# Patient Record
Sex: Male | Born: 1975 | Race: White | Hispanic: No | Marital: Married | Smoking: Current every day smoker
Health system: Southern US, Community
[De-identification: ages and names within clinical notes are randomized; demographics above are authoritative.]

## PROBLEM LIST (undated history)

## (undated) DIAGNOSIS — I10 Essential (primary) hypertension: Secondary | ICD-10-CM

## (undated) DIAGNOSIS — J45909 Unspecified asthma, uncomplicated: Secondary | ICD-10-CM

## (undated) DIAGNOSIS — E785 Hyperlipidemia, unspecified: Secondary | ICD-10-CM

## (undated) HISTORY — DX: Essential (primary) hypertension: I10

## (undated) HISTORY — DX: Hyperlipidemia, unspecified: E78.5

## (undated) HISTORY — DX: Unspecified asthma, uncomplicated: J45.909

---

## 2001-04-15 ENCOUNTER — Ambulatory Visit (HOSPITAL_COMMUNITY): Admission: RE | Admit: 2001-04-15 | Discharge: 2001-04-15 | Payer: Self-pay | Admitting: Internal Medicine

## 2002-09-17 ENCOUNTER — Ambulatory Visit: Admission: RE | Admit: 2002-09-17 | Discharge: 2002-09-17 | Payer: Self-pay | Admitting: Internal Medicine

## 2002-09-23 ENCOUNTER — Inpatient Hospital Stay (HOSPITAL_COMMUNITY): Admission: EM | Admit: 2002-09-23 | Discharge: 2002-09-27 | Payer: Self-pay | Admitting: Internal Medicine

## 2002-11-04 ENCOUNTER — Ambulatory Visit (HOSPITAL_COMMUNITY): Admission: RE | Admit: 2002-11-04 | Discharge: 2002-11-04 | Payer: Self-pay | Admitting: Internal Medicine

## 2003-01-28 ENCOUNTER — Inpatient Hospital Stay (HOSPITAL_COMMUNITY): Admission: EM | Admit: 2003-01-28 | Discharge: 2003-01-30 | Payer: Self-pay | Admitting: Emergency Medicine

## 2015-05-26 DIAGNOSIS — L600 Ingrowing nail: Secondary | ICD-10-CM | POA: Diagnosis not present

## 2015-06-09 DIAGNOSIS — L600 Ingrowing nail: Secondary | ICD-10-CM | POA: Diagnosis not present

## 2015-11-27 DIAGNOSIS — Z6835 Body mass index (BMI) 35.0-35.9, adult: Secondary | ICD-10-CM | POA: Diagnosis not present

## 2015-11-27 DIAGNOSIS — R05 Cough: Secondary | ICD-10-CM | POA: Diagnosis not present

## 2015-11-27 DIAGNOSIS — J060 Acute laryngopharyngitis: Secondary | ICD-10-CM | POA: Diagnosis not present

## 2015-11-27 DIAGNOSIS — R0981 Nasal congestion: Secondary | ICD-10-CM | POA: Diagnosis not present

## 2016-03-04 DIAGNOSIS — E782 Mixed hyperlipidemia: Secondary | ICD-10-CM | POA: Diagnosis not present

## 2016-03-11 DIAGNOSIS — Z0000 Encounter for general adult medical examination without abnormal findings: Secondary | ICD-10-CM | POA: Diagnosis not present

## 2016-03-11 DIAGNOSIS — E782 Mixed hyperlipidemia: Secondary | ICD-10-CM | POA: Diagnosis not present

## 2016-03-11 DIAGNOSIS — K219 Gastro-esophageal reflux disease without esophagitis: Secondary | ICD-10-CM | POA: Diagnosis not present

## 2016-03-11 DIAGNOSIS — E6609 Other obesity due to excess calories: Secondary | ICD-10-CM | POA: Diagnosis not present

## 2016-09-11 DIAGNOSIS — L309 Dermatitis, unspecified: Secondary | ICD-10-CM | POA: Diagnosis not present

## 2016-09-11 DIAGNOSIS — Z6839 Body mass index (BMI) 39.0-39.9, adult: Secondary | ICD-10-CM | POA: Diagnosis not present

## 2016-09-11 DIAGNOSIS — J0100 Acute maxillary sinusitis, unspecified: Secondary | ICD-10-CM | POA: Diagnosis not present

## 2016-09-11 DIAGNOSIS — R600 Localized edema: Secondary | ICD-10-CM | POA: Diagnosis not present

## 2016-09-30 DIAGNOSIS — Z713 Dietary counseling and surveillance: Secondary | ICD-10-CM | POA: Diagnosis not present

## 2016-09-30 DIAGNOSIS — R361 Hematospermia: Secondary | ICD-10-CM | POA: Diagnosis not present

## 2016-09-30 DIAGNOSIS — Z6841 Body Mass Index (BMI) 40.0 and over, adult: Secondary | ICD-10-CM | POA: Diagnosis not present

## 2016-09-30 DIAGNOSIS — E6609 Other obesity due to excess calories: Secondary | ICD-10-CM | POA: Diagnosis not present

## 2016-10-14 DIAGNOSIS — E6609 Other obesity due to excess calories: Secondary | ICD-10-CM | POA: Diagnosis not present

## 2016-10-14 DIAGNOSIS — R361 Hematospermia: Secondary | ICD-10-CM | POA: Diagnosis not present

## 2016-10-14 DIAGNOSIS — Z6841 Body Mass Index (BMI) 40.0 and over, adult: Secondary | ICD-10-CM | POA: Diagnosis not present

## 2016-11-11 DIAGNOSIS — Z23 Encounter for immunization: Secondary | ICD-10-CM | POA: Diagnosis not present

## 2016-11-11 DIAGNOSIS — Z6841 Body Mass Index (BMI) 40.0 and over, adult: Secondary | ICD-10-CM | POA: Diagnosis not present

## 2016-11-11 DIAGNOSIS — E6609 Other obesity due to excess calories: Secondary | ICD-10-CM | POA: Diagnosis not present

## 2016-12-11 DIAGNOSIS — J45901 Unspecified asthma with (acute) exacerbation: Secondary | ICD-10-CM | POA: Diagnosis not present

## 2016-12-11 DIAGNOSIS — Z6841 Body Mass Index (BMI) 40.0 and over, adult: Secondary | ICD-10-CM | POA: Diagnosis not present

## 2017-01-15 DIAGNOSIS — R600 Localized edema: Secondary | ICD-10-CM | POA: Diagnosis not present

## 2017-02-06 DIAGNOSIS — K219 Gastro-esophageal reflux disease without esophagitis: Secondary | ICD-10-CM | POA: Diagnosis not present

## 2017-02-06 DIAGNOSIS — Z713 Dietary counseling and surveillance: Secondary | ICD-10-CM | POA: Diagnosis not present

## 2017-02-06 DIAGNOSIS — E6609 Other obesity due to excess calories: Secondary | ICD-10-CM | POA: Diagnosis not present

## 2017-03-12 DIAGNOSIS — K219 Gastro-esophageal reflux disease without esophagitis: Secondary | ICD-10-CM | POA: Diagnosis not present

## 2017-03-12 DIAGNOSIS — Z6841 Body Mass Index (BMI) 40.0 and over, adult: Secondary | ICD-10-CM | POA: Diagnosis not present

## 2017-03-12 DIAGNOSIS — Z713 Dietary counseling and surveillance: Secondary | ICD-10-CM | POA: Diagnosis not present

## 2017-03-12 DIAGNOSIS — E663 Overweight: Secondary | ICD-10-CM | POA: Diagnosis not present

## 2017-03-12 DIAGNOSIS — I10 Essential (primary) hypertension: Secondary | ICD-10-CM | POA: Diagnosis not present

## 2017-04-09 DIAGNOSIS — E6609 Other obesity due to excess calories: Secondary | ICD-10-CM | POA: Diagnosis not present

## 2017-04-09 DIAGNOSIS — I10 Essential (primary) hypertension: Secondary | ICD-10-CM | POA: Diagnosis not present

## 2017-04-09 DIAGNOSIS — Z6841 Body Mass Index (BMI) 40.0 and over, adult: Secondary | ICD-10-CM | POA: Diagnosis not present

## 2017-04-09 DIAGNOSIS — Z713 Dietary counseling and surveillance: Secondary | ICD-10-CM | POA: Diagnosis not present

## 2017-05-06 DIAGNOSIS — R361 Hematospermia: Secondary | ICD-10-CM | POA: Diagnosis not present

## 2017-05-06 DIAGNOSIS — E782 Mixed hyperlipidemia: Secondary | ICD-10-CM | POA: Diagnosis not present

## 2017-05-06 DIAGNOSIS — E6609 Other obesity due to excess calories: Secondary | ICD-10-CM | POA: Diagnosis not present

## 2017-05-06 DIAGNOSIS — E663 Overweight: Secondary | ICD-10-CM | POA: Diagnosis not present

## 2017-05-06 DIAGNOSIS — K219 Gastro-esophageal reflux disease without esophagitis: Secondary | ICD-10-CM | POA: Diagnosis not present

## 2017-05-08 DIAGNOSIS — M25569 Pain in unspecified knee: Secondary | ICD-10-CM | POA: Diagnosis not present

## 2017-05-08 DIAGNOSIS — J309 Allergic rhinitis, unspecified: Secondary | ICD-10-CM | POA: Diagnosis not present

## 2017-05-08 DIAGNOSIS — K219 Gastro-esophageal reflux disease without esophagitis: Secondary | ICD-10-CM | POA: Diagnosis not present

## 2017-05-08 DIAGNOSIS — Z0000 Encounter for general adult medical examination without abnormal findings: Secondary | ICD-10-CM | POA: Diagnosis not present

## 2017-06-06 DIAGNOSIS — Z713 Dietary counseling and surveillance: Secondary | ICD-10-CM | POA: Diagnosis not present

## 2017-06-06 DIAGNOSIS — E6609 Other obesity due to excess calories: Secondary | ICD-10-CM | POA: Diagnosis not present

## 2017-06-06 DIAGNOSIS — M25562 Pain in left knee: Secondary | ICD-10-CM | POA: Diagnosis not present

## 2017-06-06 DIAGNOSIS — J309 Allergic rhinitis, unspecified: Secondary | ICD-10-CM | POA: Diagnosis not present

## 2017-07-11 DIAGNOSIS — Z713 Dietary counseling and surveillance: Secondary | ICD-10-CM | POA: Diagnosis not present

## 2017-07-11 DIAGNOSIS — Z6839 Body mass index (BMI) 39.0-39.9, adult: Secondary | ICD-10-CM | POA: Diagnosis not present

## 2017-07-11 DIAGNOSIS — M25569 Pain in unspecified knee: Secondary | ICD-10-CM | POA: Diagnosis not present

## 2017-11-04 DIAGNOSIS — E782 Mixed hyperlipidemia: Secondary | ICD-10-CM | POA: Diagnosis not present

## 2017-11-04 DIAGNOSIS — I10 Essential (primary) hypertension: Secondary | ICD-10-CM | POA: Diagnosis not present

## 2017-11-04 DIAGNOSIS — R361 Hematospermia: Secondary | ICD-10-CM | POA: Diagnosis not present

## 2017-11-04 DIAGNOSIS — K219 Gastro-esophageal reflux disease without esophagitis: Secondary | ICD-10-CM | POA: Diagnosis not present

## 2017-11-04 DIAGNOSIS — E6609 Other obesity due to excess calories: Secondary | ICD-10-CM | POA: Diagnosis not present

## 2017-11-06 DIAGNOSIS — Z713 Dietary counseling and surveillance: Secondary | ICD-10-CM | POA: Diagnosis not present

## 2017-11-06 DIAGNOSIS — K219 Gastro-esophageal reflux disease without esophagitis: Secondary | ICD-10-CM | POA: Diagnosis not present

## 2017-11-06 DIAGNOSIS — Z23 Encounter for immunization: Secondary | ICD-10-CM | POA: Diagnosis not present

## 2017-11-06 DIAGNOSIS — E7800 Pure hypercholesterolemia, unspecified: Secondary | ICD-10-CM | POA: Diagnosis not present

## 2017-11-06 DIAGNOSIS — L700 Acne vulgaris: Secondary | ICD-10-CM | POA: Diagnosis not present

## 2017-12-03 DIAGNOSIS — Z6841 Body Mass Index (BMI) 40.0 and over, adult: Secondary | ICD-10-CM | POA: Diagnosis not present

## 2017-12-03 DIAGNOSIS — M25562 Pain in left knee: Secondary | ICD-10-CM | POA: Diagnosis not present

## 2018-02-05 DIAGNOSIS — J019 Acute sinusitis, unspecified: Secondary | ICD-10-CM | POA: Diagnosis not present

## 2018-02-05 DIAGNOSIS — J060 Acute laryngopharyngitis: Secondary | ICD-10-CM | POA: Diagnosis not present

## 2018-05-13 DIAGNOSIS — J309 Allergic rhinitis, unspecified: Secondary | ICD-10-CM | POA: Diagnosis not present

## 2018-07-28 DIAGNOSIS — I10 Essential (primary) hypertension: Secondary | ICD-10-CM | POA: Diagnosis not present

## 2018-07-28 DIAGNOSIS — E6609 Other obesity due to excess calories: Secondary | ICD-10-CM | POA: Diagnosis not present

## 2018-07-28 DIAGNOSIS — E7800 Pure hypercholesterolemia, unspecified: Secondary | ICD-10-CM | POA: Diagnosis not present

## 2018-07-28 DIAGNOSIS — E782 Mixed hyperlipidemia: Secondary | ICD-10-CM | POA: Diagnosis not present

## 2018-07-28 DIAGNOSIS — R361 Hematospermia: Secondary | ICD-10-CM | POA: Diagnosis not present

## 2018-07-28 DIAGNOSIS — K219 Gastro-esophageal reflux disease without esophagitis: Secondary | ICD-10-CM | POA: Diagnosis not present

## 2018-08-04 DIAGNOSIS — J309 Allergic rhinitis, unspecified: Secondary | ICD-10-CM | POA: Diagnosis not present

## 2018-08-04 DIAGNOSIS — Z0001 Encounter for general adult medical examination with abnormal findings: Secondary | ICD-10-CM | POA: Diagnosis not present

## 2018-08-04 DIAGNOSIS — R0602 Shortness of breath: Secondary | ICD-10-CM | POA: Diagnosis not present

## 2018-08-04 DIAGNOSIS — K219 Gastro-esophageal reflux disease without esophagitis: Secondary | ICD-10-CM | POA: Diagnosis not present

## 2018-09-02 DIAGNOSIS — Z6841 Body Mass Index (BMI) 40.0 and over, adult: Secondary | ICD-10-CM | POA: Diagnosis not present

## 2018-09-02 DIAGNOSIS — R197 Diarrhea, unspecified: Secondary | ICD-10-CM | POA: Diagnosis not present

## 2018-09-02 DIAGNOSIS — Z20828 Contact with and (suspected) exposure to other viral communicable diseases: Secondary | ICD-10-CM | POA: Diagnosis not present

## 2018-11-06 DIAGNOSIS — Z713 Dietary counseling and surveillance: Secondary | ICD-10-CM | POA: Diagnosis not present

## 2018-11-06 DIAGNOSIS — M25562 Pain in left knee: Secondary | ICD-10-CM | POA: Diagnosis not present

## 2018-11-06 DIAGNOSIS — J309 Allergic rhinitis, unspecified: Secondary | ICD-10-CM | POA: Diagnosis not present

## 2018-11-23 ENCOUNTER — Other Ambulatory Visit: Payer: Self-pay

## 2018-11-23 ENCOUNTER — Ambulatory Visit: Payer: BC Managed Care – PPO | Admitting: Orthopedic Surgery

## 2018-11-23 ENCOUNTER — Ambulatory Visit: Payer: BC Managed Care – PPO

## 2018-11-23 ENCOUNTER — Encounter: Payer: Self-pay | Admitting: Orthopedic Surgery

## 2018-11-23 VITALS — BP 131/92 | HR 94 | Temp 97.2°F | Ht 72.0 in | Wt 285.2 lb

## 2018-11-23 DIAGNOSIS — G8929 Other chronic pain: Secondary | ICD-10-CM

## 2018-11-23 DIAGNOSIS — M25562 Pain in left knee: Secondary | ICD-10-CM

## 2018-11-23 DIAGNOSIS — M1710 Unilateral primary osteoarthritis, unspecified knee: Secondary | ICD-10-CM

## 2018-11-23 DIAGNOSIS — M1712 Unilateral primary osteoarthritis, left knee: Secondary | ICD-10-CM

## 2018-11-23 MED ORDER — DICLOFENAC POTASSIUM 50 MG PO TABS: 50.0000 mg | ORAL_TABLET | Freq: Two times a day (BID) | ORAL | 5 refills | Status: DC

## 2018-11-23 NOTE — Patient Instructions (Addendum)
KEEP THE WEIGHT DOWN   IF POSSIBLE STOP OR DECREASE THE AMOUNT OF TOBACCO USE   LIGHT EXERCISE DAILY 15 MIN WALK INCREASE AS TOLERATED    Chronic Knee Pain, Adult Chronic knee pain is pain in one or both knees that lasts longer than 3 months. Symptoms of chronic knee pain may include swelling, stiffness, and discomfort. Age-related wear and tear (osteoarthritis) of the knee joint is the most common cause of chronic knee pain. Other possible causes include:  A long-term immune-related disease that causes inflammation of the knee (rheumatoid arthritis). This usually affects both knees.  Inflammatory arthritis, such as gout or pseudogout.  An injury to the knee that causes arthritis.  An injury to the knee that damages the ligaments. Ligaments are strong tissues that connect bones to each other.  Runner's knee or pain behind the kneecap. Treatment for chronic knee pain depends on the cause. The main treatments for chronic knee pain are physical therapy and weight loss. This condition may also be treated with medicines, injections, a knee sleeve or brace, and by using crutches. Rest, ice, compression (pressure), and elevation (RICE) therapy may also be recommended. Follow these instructions at home: If you have a knee sleeve or brace:   Wear it as told by your health care provider. Remove it only as told by your health care provider.  Loosen it if your toes tingle, become numb, or turn cold and blue.  Keep it clean.  If the sleeve or brace is not waterproof: ? Do not let it get wet. ? Remove it if allowed by your health care provider, or cover it with a watertight covering when you take a bath or a shower. Managing pain, stiffness, and swelling      If directed, apply heat to the affected area as often as told by your health care provider. Use the heat source that your health care provider recommends, such as a moist heat pack or a heating pad. ? If you have a removable sleeve or  brace, remove it as told by your health care provider. ? Place a towel between your skin and the heat source. ? Leave the heat on for 20-30 minutes. ? Remove the heat if your skin turns bright red. This is especially important if you are unable to feel pain, heat, or cold. You may have a greater risk of getting burned.  If directed, put ice on the affected area. ? If you have a removable sleeve or brace, remove it as told by your health care provider. ? Put ice in a plastic bag. ? Place a towel between your skin and the bag. ? Leave the ice on for 20 minutes, 2-3 times a day.  Move your toes often to reduce stiffness and swelling.  Raise (elevate) the injured area above the level of your heart while you are sitting or lying down. Activity  Avoid activities where both feet leave the ground at the same time (high-impact activities). Examples are running, jumping rope, and doing jumping jacks.  Return to your normal activities as told by your health care provider. Ask your health care provider what activities are safe for you.  Follow the exercise plan that your health care provider designed for you. Your health care provider may suggest that you: ? Avoid activities that make knee pain worse. This may require you to change your exercise routines, sport participation, or job duties. ? Wear shoes with cushioned soles. ? Avoid high-impact activities or sports that require  running and sudden changes in direction. ? Do physical therapy as told by your health care provider. Physical therapy is planned to match your needs and abilities. It may include exercises for strength, flexibility, stability, and endurance. ? Do exercises that increase balance and strength, such as tai chi and yoga.  Do not use the injured limb to support your body weight until your health care provider says that you can. Use crutches, a cane, or a walker, as told by your health care provider. General instructions  Take  over-the-counter and prescription medicines only as told by your health care provider.  Lose weight if you are overweight. Losing even a little weight can reduce knee pain. Ask your health care provider what your ideal weight is, and how to safely lose extra weight. A food expert (dietitian) may be able to help you plan your meals.  Do not use any products that contain nicotine or tobacco, such as cigarettes, e-cigarettes, and chewing tobacco. These can delay healing. If you need help quitting, ask your health care provider.  Keep all follow-up visits as told by your health care provider. This is important. Contact a health care provider if:  You have knee pain that is not getting better or gets worse.  You are unable to do your physical therapy exercises due to knee pain. Get help right away if:  Your knee swells and the swelling becomes worse.  You cannot move your knee.  You have severe knee pain. Summary  Knee pain that lasts more than 3 months is considered chronic knee pain.  The main treatments for chronic knee pain are physical therapy and weight loss. You may also need to take medicines, wear a knee sleeve or brace, use crutches, and apply ice or heat.  Losing even a little weight can reduce knee pain. Ask your health care provider what your ideal weight is, and how to safely lose extra weight. A food expert (dietitian) may be able to help you plan your meals.  Work with a physical therapist to make a safe exercise program, as told by your health care provider. This information is not intended to replace advice given to you by your health care provider. Make sure you discuss any questions you have with your health care provider. Document Released: 04/16/2018 Document Revised: 04/16/2018 Document Reviewed: 04/16/2018 Elsevier Patient Education  2020 [employer].
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
## 2019-01-04 ENCOUNTER — Ambulatory Visit (INDEPENDENT_AMBULATORY_CARE_PROVIDER_SITE_OTHER): Payer: BC Managed Care – PPO | Admitting: Orthopedic Surgery

## 2019-01-04 ENCOUNTER — Other Ambulatory Visit: Payer: Self-pay

## 2019-01-04 VITALS — BP 135/98 | HR 93 | Temp 97.3°F | Ht 72.0 in | Wt 285.0 lb

## 2019-01-04 DIAGNOSIS — G8929 Other chronic pain: Secondary | ICD-10-CM

## 2019-01-04 DIAGNOSIS — M25562 Pain in left knee: Secondary | ICD-10-CM

## 2019-01-04 MED ORDER — MELOXICAM 7.5 MG PO TABS: 7.5000 mg | ORAL_TABLET | Freq: Two times a day (BID) | ORAL | 0 refills | Status: DC
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
## 2019-01-07 ENCOUNTER — Telehealth: Payer: Self-pay | Admitting: Radiology
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
## 2019-01-07 NOTE — Telephone Encounter (Signed)
Call received (on line currently) from [place] at [facility] regarding pre-certification, ph# [phone]
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
## 2019-01-07 NOTE — Telephone Encounter (Signed)
cancel

## 2019-01-13 DIAGNOSIS — Z713 Dietary counseling and surveillance: Secondary | ICD-10-CM | POA: Diagnosis not present

## 2019-01-13 DIAGNOSIS — J309 Allergic rhinitis, unspecified: Secondary | ICD-10-CM | POA: Diagnosis not present

## 2019-01-13 DIAGNOSIS — M25562 Pain in left knee: Secondary | ICD-10-CM | POA: Diagnosis not present

## 2019-02-10 DIAGNOSIS — Z20828 Contact with and (suspected) exposure to other viral communicable diseases: Secondary | ICD-10-CM | POA: Diagnosis not present

## 2019-02-10 DIAGNOSIS — E782 Mixed hyperlipidemia: Secondary | ICD-10-CM | POA: Diagnosis not present

## 2019-02-10 DIAGNOSIS — E6609 Other obesity due to excess calories: Secondary | ICD-10-CM | POA: Diagnosis not present

## 2019-02-10 DIAGNOSIS — Z713 Dietary counseling and surveillance: Secondary | ICD-10-CM | POA: Diagnosis not present

## 2019-02-10 DIAGNOSIS — R361 Hematospermia: Secondary | ICD-10-CM | POA: Diagnosis not present

## 2019-02-10 DIAGNOSIS — K219 Gastro-esophageal reflux disease without esophagitis: Secondary | ICD-10-CM | POA: Diagnosis not present

## 2019-03-15 DIAGNOSIS — Z6841 Body Mass Index (BMI) 40.0 and over, adult: Secondary | ICD-10-CM | POA: Diagnosis not present

## 2019-04-12 DIAGNOSIS — I10 Essential (primary) hypertension: Secondary | ICD-10-CM | POA: Diagnosis not present

## 2019-04-12 DIAGNOSIS — E7800 Pure hypercholesterolemia, unspecified: Secondary | ICD-10-CM | POA: Diagnosis not present

## 2019-04-12 DIAGNOSIS — R361 Hematospermia: Secondary | ICD-10-CM | POA: Diagnosis not present

## 2019-04-12 DIAGNOSIS — K219 Gastro-esophageal reflux disease without esophagitis: Secondary | ICD-10-CM | POA: Diagnosis not present

## 2019-04-12 DIAGNOSIS — E6609 Other obesity due to excess calories: Secondary | ICD-10-CM | POA: Diagnosis not present

## 2019-04-12 DIAGNOSIS — E782 Mixed hyperlipidemia: Secondary | ICD-10-CM | POA: Diagnosis not present

## 2019-04-15 DIAGNOSIS — E785 Hyperlipidemia, unspecified: Secondary | ICD-10-CM | POA: Diagnosis not present

## 2019-04-15 DIAGNOSIS — M25562 Pain in left knee: Secondary | ICD-10-CM | POA: Diagnosis not present

## 2019-04-15 DIAGNOSIS — Z6841 Body Mass Index (BMI) 40.0 and over, adult: Secondary | ICD-10-CM | POA: Diagnosis not present

## 2019-08-24 DIAGNOSIS — J069 Acute upper respiratory infection, unspecified: Secondary | ICD-10-CM | POA: Diagnosis not present

## 2019-10-05 DIAGNOSIS — E7800 Pure hypercholesterolemia, unspecified: Secondary | ICD-10-CM | POA: Diagnosis not present

## 2019-10-05 DIAGNOSIS — E6609 Other obesity due to excess calories: Secondary | ICD-10-CM | POA: Diagnosis not present

## 2019-10-05 DIAGNOSIS — E782 Mixed hyperlipidemia: Secondary | ICD-10-CM | POA: Diagnosis not present

## 2019-10-05 DIAGNOSIS — I10 Essential (primary) hypertension: Secondary | ICD-10-CM | POA: Diagnosis not present

## 2019-10-05 DIAGNOSIS — R361 Hematospermia: Secondary | ICD-10-CM | POA: Diagnosis not present

## 2019-10-05 DIAGNOSIS — K219 Gastro-esophageal reflux disease without esophagitis: Secondary | ICD-10-CM | POA: Diagnosis not present

## 2019-10-07 DIAGNOSIS — Z0001 Encounter for general adult medical examination with abnormal findings: Secondary | ICD-10-CM | POA: Diagnosis not present

## 2019-11-01 DIAGNOSIS — L570 Actinic keratosis: Secondary | ICD-10-CM | POA: Diagnosis not present

## 2019-11-01 DIAGNOSIS — C44622 Squamous cell carcinoma of skin of right upper limb, including shoulder: Secondary | ICD-10-CM | POA: Diagnosis not present

## 2019-11-01 DIAGNOSIS — X32XXXA Exposure to sunlight, initial encounter: Secondary | ICD-10-CM | POA: Diagnosis not present

## 2019-11-15 DIAGNOSIS — Z85828 Personal history of other malignant neoplasm of skin: Secondary | ICD-10-CM | POA: Diagnosis not present

## 2019-11-15 DIAGNOSIS — L820 Inflamed seborrheic keratosis: Secondary | ICD-10-CM | POA: Diagnosis not present

## 2019-11-15 DIAGNOSIS — L281 Prurigo nodularis: Secondary | ICD-10-CM | POA: Diagnosis not present

## 2019-11-15 DIAGNOSIS — L570 Actinic keratosis: Secondary | ICD-10-CM | POA: Diagnosis not present

## 2019-11-15 DIAGNOSIS — X32XXXD Exposure to sunlight, subsequent encounter: Secondary | ICD-10-CM | POA: Diagnosis not present

## 2019-11-15 DIAGNOSIS — L859 Epidermal thickening, unspecified: Secondary | ICD-10-CM | POA: Diagnosis not present

## 2019-11-15 DIAGNOSIS — Z08 Encounter for follow-up examination after completed treatment for malignant neoplasm: Secondary | ICD-10-CM | POA: Diagnosis not present

## 2020-02-06 ENCOUNTER — Ambulatory Visit
Admission: RE | Admit: 2020-02-06 | Discharge: 2020-02-06 | Disposition: A | Discharge status: Home / Self Care | Payer: BC Managed Care – PPO | Source: Ambulatory Visit | Attending: Emergency Medicine | Admitting: Emergency Medicine

## 2020-02-06 ENCOUNTER — Other Ambulatory Visit: Payer: Self-pay

## 2020-02-06 VITALS — BP 157/99 | HR 107 | Temp 99.4°F | Resp 15

## 2020-02-06 DIAGNOSIS — H6592 Unspecified nonsuppurative otitis media, left ear: Secondary | ICD-10-CM | POA: Diagnosis not present

## 2020-02-06 DIAGNOSIS — Z1152 Encounter for screening for COVID-19: Secondary | ICD-10-CM | POA: Diagnosis not present

## 2020-02-06 DIAGNOSIS — J069 Acute upper respiratory infection, unspecified: Secondary | ICD-10-CM | POA: Diagnosis not present

## 2020-02-06 MED ORDER — FLUTICASONE PROPIONATE 50 MCG/ACT NA SUSP: 1.0000 | Freq: Every day | NASAL | 0 refills | Status: DC

## 2020-02-06 MED ORDER — CETIRIZINE HCL 10 MG PO TABS: 10.0000 mg | ORAL_TABLET | Freq: Every day | ORAL | 0 refills | Status: AC

## 2020-02-06 MED ORDER — PREDNISONE 10 MG PO TABS: 20.0000 mg | ORAL_TABLET | Freq: Every day | ORAL | 0 refills | Status: DC

## 2020-02-06 MED ORDER — BENZONATATE 100 MG PO CAPS: 100.0000 mg | ORAL_CAPSULE | Freq: Three times a day (TID) | ORAL | 0 refills | Status: AC | PRN

## 2020-02-06 NOTE — Discharge Instructions (Signed)
COVID testing ordered.  It will take between 2-7 days for test results.  Someone will contact you regarding abnormal results.   ° °In the meantime: °You should remain isolated in your home for 10 days from symptom onset AND greater than 72 hours after symptoms resolution (absence of fever without the use of fever-reducing medication and improvement in respiratory symptoms), whichever is longer °Get plenty of rest and push fluids °Tessalon Perles prescribed for cough °Zyrtec for nasal congestion, runny nose, and/or sore throat °Flonase for nasal congestion and runny nose °Prednisone was prescribed °Use medications daily for symptom relief °Use OTC medications like ibuprofen or tylenol as needed fever or pain °Call or go to the ED if you have any new or worsening symptoms such as fever, worsening cough, shortness of breath, chest tightness, chest pain, turning blue, changes in mental status, etc... °
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
## 2020-02-06 NOTE — ED Provider Notes (Signed)
[facility]   [id number] 02/06/20 Arrival Time: 1345   CC: COVID symptoms  SUBJECTIVE: History from: patient.  Cory Page is a 44 y.o. male who resented to the urgent care with a complaint of ear pain, cough, nasal congestion and Covid exposure for the past few days.  Denies sick exposure to COVID, flu or strep.  Denies recent travel.  Has tried OTC medication without relief.  Denies aggravating factors.  Denies previous symptoms in the past.   Denies fever, chills, fatigue, sinus pain, rhinorrhea, sore throat, SOB, wheezing, chest pain, nausea, changes in bowel or bladder habits.    ROS: As per HPI.  All other pertinent ROS negative.     Past Medical History:  Diagnosis Date  . Asthma    History reviewed. No pertinent surgical history. No Known Allergies No current facility-administered medications on file prior to encounter.   Current Outpatient Medications on File Prior to Encounter  Medication Sig Dispense Refill  . diclofenac (CATAFLAM) 50 MG tablet Take 1 tablet (50 mg total) by mouth 2 (two) times daily. 60 tablet 5  . loratadine (CLARITIN) 10 MG tablet Take 10 mg by mouth daily.    . meloxicam (MOBIC) 7.5 MG tablet Take 1 tablet (7.5 mg total) by mouth 2 (two) times daily. 60 tablet 0  . VENTOLIN HFA 108 (90 Base) MCG/ACT inhaler Inhale 1 puff into the lungs every 6 (six) hours as needed (2x day every 2 - 3 days).      Social History   Socioeconomic History  . Marital status: Married    Spouse name: Not on file  . Number of children: Not on file  . Years of education: Not on file  . Highest education level: Not on file  Occupational History  . Not on file  Tobacco Use  . Smoking status: Never Smoker  . Smokeless tobacco: Never Used  Substance and Sexual Activity  . Alcohol use: Not on file  . Drug use: Not on file  . Sexual activity: Not on file  Other Topics Concern  . Not on file  Social History Narrative  . Not on file   Social  Determinants of Health   Financial Resource Strain: Not on file  Food Insecurity: Not on file  Transportation Needs: Not on file  Physical Activity: Not on file  Stress: Not on file  Social Connections: Not on file  Intimate Partner Violence: Not on file   Family History  Problem Relation Age of Onset  . Diabetes Father   . Heart disease Maternal Grandmother   . Heart disease Maternal Grandfather   . Cancer Paternal Grandfather        bladder    OBJECTIVE:  Vitals:   02/06/20 1423  BP: (!) 157/99  Pulse: (!) 107  Resp: 15  Temp: 99.4 F (37.4 C)  SpO2: 97%     General appearance: alert; appears fatigued, but nontoxic; speaking in full sentences and tolerating own secretions HEENT: NCAT; Ears: EACs clear, TMs pearly gray; Eyes: PERRL.  EOM grossly intact. Sinuses: nontender; Nose: nares patent without rhinorrhea, Throat: oropharynx clear, tonsils non erythematous or enlarged, uvula midline  Neck: supple without LAD Lungs: unlabored respirations, symmetrical air entry; cough: moderate; no respiratory distress; CTAB Heart: regular rate and rhythm.  Radial pulses 2+ symmetrical bilaterally Skin: warm and dry Psychological: alert and cooperative; normal mood and affect  LABS:  No results found for this or any previous visit (from the past 24 hour(s)).  ASSESSMENT & PLAN:  1. Encounter for screening for COVID-19   2. URI with cough and congestion   3. Fluid level behind tympanic membrane of left ear     Meds ordered this encounter  Medications  . cetirizine (ZYRTEC ALLERGY) 10 MG tablet    Sig: Take 1 tablet (10 mg total) by mouth daily.    Dispense:  30 tablet    Refill:  0  . benzonatate (TESSALON) 100 MG capsule    Sig: Take 1 capsule (100 mg total) by mouth 3 (three) times daily as needed for cough.    Dispense:  30 capsule    Refill:  0  . fluticasone (FLONASE) 50 MCG/ACT nasal spray    Sig: Place 1 spray into both nostrils daily for 14 days.    Dispense:   16 g    Refill:  0  . predniSONE (DELTASONE) 10 MG tablet    Sig: Take 2 tablets (20 mg total) by mouth daily.    Dispense:  15 tablet    Refill:  0    Discharge Instructions.    COVID testing ordered.  It will take between 2-7 days for test results.  Someone will contact you regarding abnormal results.    In the meantime: You should remain isolated in your home for 10 days from symptom onset AND greater than 72 hours after symptoms resolution (absence of fever without the use of fever-reducing medication and improvement in respiratory symptoms), whichever is longer Get plenty of rest and push fluids Tessalon Perles prescribed for cough Zyrtec for nasal congestion, runny nose, and/or sore throat Flonase for nasal congestion and runny nose Prednisone was prescribed Use medications daily for symptom relief Use OTC medications like ibuprofen or tylenol as needed fever or pain Call or go to the ED if you have any new or worsening symptoms such as fever, worsening cough, shortness of breath, chest tightness, chest pain, turning blue, changes in mental status, etc...   Reviewed expectations re: course of current medical issues. Questions answered. Outlined signs and symptoms indicating need for more acute intervention. Patient verbalized understanding. After Visit Summary given.         Durward Parcel, FNP 02/06/20 1536

## 2020-02-08 LAB — NOVEL CORONAVIRUS, NAA: SARS-CoV-2, NAA: DETECTED — AB

## 2020-02-08 LAB — SARS-COV-2, NAA 2 DAY TAT

## 2020-02-09 ENCOUNTER — Telehealth (HOSPITAL_COMMUNITY): Payer: Self-pay | Admitting: Family

## 2020-02-09 NOTE — Telephone Encounter (Signed)
.  Called to discuss with [name] about Covid symptoms and the use of casirivimab/imdevimab, a combination monoclonal antibody infusion for those with mild to moderate Covid symptoms and at a high risk of hospitalization.     Pt is qualified for this infusion at the infusion center due to co-morbid conditions and/or a member of an at-risk group, however he is not a candidate at this time as he has been fully vaccinated with Laural Benes and [employer] vaccine. Symptoms tier reviewed as well as criteria for ending isolation.  Symptoms reviewed that would warrant ED/Hospital evaluation. Preventative practices reviewed. Patient verbalized understanding.   There are no problems to display for this patient.   Cory Shorey,NP

## 2020-02-15 ENCOUNTER — Ambulatory Visit
Admission: RE | Admit: 2020-02-15 | Discharge: 2020-02-15 | Disposition: A | Discharge status: Home / Self Care | Payer: BC Managed Care – PPO | Source: Ambulatory Visit | Attending: Physician Assistant | Admitting: Physician Assistant

## 2020-02-15 ENCOUNTER — Other Ambulatory Visit: Payer: Self-pay

## 2020-02-15 ENCOUNTER — Ambulatory Visit (INDEPENDENT_AMBULATORY_CARE_PROVIDER_SITE_OTHER): Payer: BC Managed Care – PPO

## 2020-02-15 VITALS — BP 153/85 | HR 97 | Temp 98.3°F | Resp 19

## 2020-02-15 DIAGNOSIS — J189 Pneumonia, unspecified organism: Secondary | ICD-10-CM | POA: Diagnosis not present

## 2020-02-15 DIAGNOSIS — R0602 Shortness of breath: Secondary | ICD-10-CM | POA: Diagnosis not present

## 2020-02-15 DIAGNOSIS — J129 Viral pneumonia, unspecified: Secondary | ICD-10-CM | POA: Diagnosis not present

## 2020-02-15 DIAGNOSIS — R059 Cough, unspecified: Secondary | ICD-10-CM

## 2020-02-15 DIAGNOSIS — U071 COVID-19: Secondary | ICD-10-CM

## 2020-02-15 MED ORDER — DEXAMETHASONE 6 MG PO TABS: 6.0000 mg | ORAL_TABLET | Freq: Two times a day (BID) | ORAL | 0 refills | Status: DC

## 2020-02-15 MED ORDER — ALBUTEROL SULFATE HFA 108 (90 BASE) MCG/ACT IN AERS: 2.0000 | INHALATION_SPRAY | Freq: Four times a day (QID) | RESPIRATORY_TRACT | 0 refills | Status: AC | PRN

## 2020-02-15 NOTE — ED Provider Notes (Signed)
[facility]    CSN: [id number] Arrival date & time: 02/15/20  1557      History   Chief Complaint Chief Complaint  Patient presents with  . Appointment    4    HPI Cory Page is a 44 y.o. male.   The history is provided by the patient. No language interpreter was used.  Cough Cough characteristics:  Productive Sputum characteristics:  Nondescript Severity:  Moderate Onset quality:  Gradual Duration:  9 days Timing:  Constant Progression:  Worsening Smoker: yes   Relieved by:  Nothing Worsened by:  Nothing Ineffective treatments:  None tried Associated symptoms: shortness of breath   Pt diagnosed with covid on 12/19.  Pt reports increased shortness of breath  Past Medical History:  Diagnosis Date  . Asthma     There are no problems to display for this patient.   History reviewed. No pertinent surgical history.     Home Medications    Prior to Admission medications   Medication Sig Start Date End Date Taking? Authorizing Provider  benzonatate (TESSALON) 100 MG capsule Take 1 capsule (100 mg total) by mouth 3 (three) times daily as needed for cough. 02/06/20   Avegno, Zachery Dakins, FNP  cetirizine (ZYRTEC ALLERGY) 10 MG tablet Take 1 tablet (10 mg total) by mouth daily. 02/06/20   Avegno, Zachery Dakins, FNP  diclofenac (CATAFLAM) 50 MG tablet Take 1 tablet (50 mg total) by mouth 2 (two) times daily. 11/23/18   Vickki Hearing, MD  fluticasone (FLONASE) 50 MCG/ACT nasal spray Place 1 spray into both nostrils daily for 14 days. 02/06/20 02/20/20  Avegno, Zachery Dakins, FNP  loratadine (CLARITIN) 10 MG tablet Take 10 mg by mouth daily.    [provider]  meloxicam (MOBIC) 7.5 MG tablet Take 1 tablet (7.5 mg total) by mouth 2 (two) times daily. 01/04/19   Vickki Hearing, MD  predniSONE (DELTASONE) 10 MG tablet Take 2 tablets (20 mg total) by mouth daily. 02/06/20   Avegno, Zachery Dakins, FNP  VENTOLIN HFA 108 (90 Base) MCG/ACT inhaler Inhale  1 puff into the lungs every 6 (six) hours as needed (2x day every 2 - 3 days).  11/19/18   [provider]    Family History Family History  Problem Relation Age of Onset  . Diabetes Father   . Heart disease Maternal Grandmother   . Heart disease Maternal Grandfather   . Cancer Paternal Grandfather        bladder    Social History Social History   Tobacco Use  . Smoking status: Never Smoker  . Smokeless tobacco: Never Used     Allergies   Patient has no known allergies.   Review of Systems Review of Systems  Respiratory: Positive for cough and shortness of breath.   All other systems reviewed and are negative.    Physical Exam Triage Vital Signs ED Triage Vitals  Enc Vitals Group     BP 02/15/20 1659 (!) 153/85     Pulse Rate 02/15/20 1659 97     Resp 02/15/20 1659 19     Temp 02/15/20 1659 98.3 F (36.8 C)     Temp src --      SpO2 02/15/20 1659 95 %     Weight --      Height --      Head Circumference --      Peak Flow --      Pain Score 02/15/20 1657 3  Pain Loc --      Pain Edu? --      Excl. in GC? --    No data found.  Updated Vital Signs BP (!) 153/85   Pulse 97   Temp 98.3 F (36.8 C)   Resp 19   SpO2 95%   Visual Acuity Right Eye Distance:   Left Eye Distance:   Bilateral Distance:    Right Eye Near:   Left Eye Near:    Bilateral Near:     Physical Exam Vitals and nursing note reviewed.  Constitutional:      Appearance: He is well-developed and well-nourished.  HENT:     Head: Normocephalic and atraumatic.     Mouth/Throat:     Mouth: Mucous membranes are moist.  Eyes:     Conjunctiva/sclera: Conjunctivae normal.  Cardiovascular:     Rate and Rhythm: Normal rate and regular rhythm.     Heart sounds: No murmur heard.   Pulmonary:     Effort: Pulmonary effort is normal. No respiratory distress.     Breath sounds: Rhonchi present.  Abdominal:     Palpations: Abdomen is soft.     Tenderness: There is no  abdominal tenderness.  Musculoskeletal:        General: No edema.     Cervical back: Neck supple.  Skin:    General: Skin is warm and dry.  Neurological:     General: No focal deficit present.     Mental Status: He is alert.  Psychiatric:        Mood and Affect: Mood and affect and mood normal.      UC Treatments / Results  Labs (all labs ordered are listed, but only abnormal results are displayed) Labs Reviewed - No data to display  EKG   Radiology DG Chest 2 View  Result Date: 02/15/2020 CLINICAL DATA:  Coronavirus infection.  Cough.  Shortness of breath. EXAM: CHEST - 2 VIEW COMPARISON:  None FINDINGS: Heart and mediastinal shadows are normal. There are mild bilateral hazy and patchy pulmonary infiltrates consistent with viral pneumonia. No dense consolidation, collapse or effusion. No significant bone finding. IMPRESSION: Mild bilateral hazy and patchy pulmonary infiltrates consistent with viral pneumonia. Electronically Signed   By: Paulina Fusi M.D.   On: 02/15/2020 17:34    Procedures Procedures (including critical care time)  Medications Ordered in UC Medications - No data to display  Initial Impression / Assessment and Plan / UC Course  I have reviewed the triage vital signs and the nursing notes.  Pertinent labs & imaging results that were available during my care of the patient were reviewed by me and considered in my medical decision making (see chart for details).     MDM:  Chest xray shows pneumonia.  Pt advised to go to ED if symptoms worsen or cahnge Final Clinical Impressions(s) / UC Diagnoses   Final diagnoses:  Pneumonia, viral     Discharge Instructions     Go to the Emergency department if symptoms worse or change    ED Prescriptions    None     PDMP not reviewed this encounter.  An After Visit Summary was printed and given to the patient.   Elson Areas, [place] 02/15/20 1756

## 2020-02-15 NOTE — ED Triage Notes (Addendum)
Pt presents with complaints of being light headed, fatigued, weak, and headache x 2 days. Pt states when he woke up this morning he was diaphoretic. Pt denies any chest pain. Pt tested positive for covid on 12/19.

## 2020-02-15 NOTE — Discharge Instructions (Addendum)
Go to the Emergency department if symptoms worse or change

## 2020-02-29 ENCOUNTER — Other Ambulatory Visit: Payer: Self-pay | Admitting: Orthopedic Surgery

## 2020-02-29 DIAGNOSIS — M25562 Pain in left knee: Secondary | ICD-10-CM

## 2020-02-29 DIAGNOSIS — G8929 Other chronic pain: Secondary | ICD-10-CM

## 2020-03-27 ENCOUNTER — Ambulatory Visit (INDEPENDENT_AMBULATORY_CARE_PROVIDER_SITE_OTHER): Payer: BC Managed Care – PPO | Admitting: Orthopedic Surgery

## 2020-03-27 ENCOUNTER — Other Ambulatory Visit: Payer: Self-pay

## 2020-03-27 ENCOUNTER — Encounter: Payer: Self-pay | Admitting: Orthopedic Surgery

## 2020-03-27 ENCOUNTER — Ambulatory Visit: Payer: BC Managed Care – PPO

## 2020-03-27 VITALS — BP 168/106 | HR 107 | Ht 72.0 in | Wt 274.5 lb

## 2020-03-27 DIAGNOSIS — S41032S Puncture wound without foreign body of left shoulder, sequela: Secondary | ICD-10-CM | POA: Diagnosis not present

## 2020-03-27 DIAGNOSIS — G8929 Other chronic pain: Secondary | ICD-10-CM | POA: Diagnosis not present

## 2020-03-27 DIAGNOSIS — M25562 Pain in left knee: Secondary | ICD-10-CM | POA: Diagnosis not present

## 2020-03-27 MED ORDER — DICLOFENAC SODIUM 75 MG PO TBEC: 75.0000 mg | DELAYED_RELEASE_TABLET | Freq: Two times a day (BID) | ORAL | 2 refills | Status: AC

## 2020-03-27 NOTE — Patient Instructions (Addendum)
Reschedule MRI left knee   Smoking Tobacco Information, Adult Smoking tobacco can be harmful to your health. Tobacco contains a poisonous (toxic), colorless chemical called nicotine. Nicotine is addictive. It changes the brain and can make it hard to stop smoking. Tobacco also has other toxic chemicals that can hurt your body and raise your risk of many cancers. How can smoking tobacco affect me? Smoking tobacco puts you at risk for:  Cancer. Smoking is most commonly associated with lung cancer, but can also lead to cancer in other parts of the body.  Chronic obstructive pulmonary disease (COPD). This is a long-term lung condition that makes it hard to breathe. It also gets worse over time.  High blood pressure (hypertension), heart disease, stroke, or heart attack.  Lung infections, such as pneumonia.  Cataracts. This is when the lenses in the eyes become clouded.  Digestive problems. This may include peptic ulcers, heartburn, and gastroesophageal reflux disease (GERD).  Oral health problems, such as gum disease and tooth loss.  Loss of taste and smell. Smoking can affect your appearance by causing:  Wrinkles.  Yellow or stained teeth, fingers, and fingernails. Smoking tobacco can also affect your social life, because:  It may be challenging to find places to smoke when away from home. Many workplaces, [employer], hotels, and public places are tobacco-free.  Smoking is expensive. This is due to the cost of tobacco and the long-term costs of treating health problems from smoking.  Secondhand smoke may affect those around you. Secondhand smoke can cause lung cancer, breathing problems, and heart disease. Children of smokers have a higher risk for: ? Sudden infant death syndrome (SIDS). ? Ear infections. ? Lung infections. If you currently smoke tobacco, quitting now can help you:  Lead a longer and healthier life.  Look, smell, breathe, and feel better over time.  Save  money.  Protect others from the harms of secondhand smoke. What actions can I take to prevent health problems? Quit smoking  Do not start smoking. Quit if you already do.  Make a plan to quit smoking and commit to it. Look for programs to help you and ask your health care provider for recommendations and ideas.  Set a date and write down all the reasons you want to quit.  Let your friends and family know you are quitting so they can help and support you. Consider finding friends who also want to quit. It can be easier to quit with someone else, so that you can support each other.  Talk with your health care provider about using nicotine replacement medicines to help you quit, such as gum, lozenges, patches, sprays, or pills.  Do not replace cigarette smoking with electronic cigarettes, which are commonly called e-cigarettes. The safety of e-cigarettes is not known, and some may contain harmful chemicals.  If you try to quit but return to smoking, stay positive. It is common to slip up when you first quit, so take it one day at a time.  Be prepared for cravings. When you feel the urge to smoke, chew gum or suck on hard candy.   Lifestyle  Stay busy and take care of your body.  Drink enough fluid to keep your urine pale yellow.  Get plenty of exercise and eat a healthy diet. This can help prevent weight gain after quitting.  Monitor your eating habits. Quitting smoking can cause you to have a larger appetite than when you smoke.  Find ways to relax. Go out with friends or  family to a movie or a restaurant where people do not smoke.  Ask your health care provider about having regular tests (screenings) to check for cancer. This may include blood tests, imaging tests, and other tests.  Find ways to manage your stress, such as meditation, yoga, or exercise. Where to find support To get support to quit smoking, consider:  Asking your health care provider for more information and  resources.  Taking classes to learn more about quitting smoking.  Looking for local organizations that offer resources about quitting smoking.  Joining a support group for people who want to quit smoking in your local community.  Calling the smokefree.gov counselor helpline: 1-800-Quit-Now 480 806 8703) Where to find more information You may find more information about quitting smoking from:  HelpGuide.org: www.helpguide.org  [employer]: smokefree.gov  American Lung Association: www.lung.org Contact a health care provider if you:  Have problems breathing.  Notice that your lips, nose, or fingers turn blue.  Have chest pain.  Are coughing up blood.  Feel faint or you pass out.  Have other health changes that cause you to worry. Summary  Smoking tobacco can negatively affect your health, the health of those around you, your finances, and your social life.  Do not start smoking. Quit if you already do. If you need help quitting, ask your health care provider.  Think about joining a support group for people who want to quit smoking in your local community. There are many effective programs that will help you to quit this behavior. This information is not intended to replace advice given to you by your health care provider. Make sure you discuss any questions you have with your health care provider. Document Revised: 10/30/2018 Document Reviewed: 02/20/2016 Elsevier Patient Education  2021 [employer].
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
## 2020-04-10 DIAGNOSIS — R361 Hematospermia: Secondary | ICD-10-CM | POA: Diagnosis not present

## 2020-04-10 DIAGNOSIS — E782 Mixed hyperlipidemia: Secondary | ICD-10-CM | POA: Diagnosis not present

## 2020-04-10 DIAGNOSIS — K219 Gastro-esophageal reflux disease without esophagitis: Secondary | ICD-10-CM | POA: Diagnosis not present

## 2020-04-10 DIAGNOSIS — E7800 Pure hypercholesterolemia, unspecified: Secondary | ICD-10-CM | POA: Diagnosis not present

## 2020-04-10 DIAGNOSIS — E6609 Other obesity due to excess calories: Secondary | ICD-10-CM | POA: Diagnosis not present

## 2020-04-10 DIAGNOSIS — I10 Essential (primary) hypertension: Secondary | ICD-10-CM | POA: Diagnosis not present

## 2020-04-26 DIAGNOSIS — E785 Hyperlipidemia, unspecified: Secondary | ICD-10-CM | POA: Diagnosis not present

## 2020-04-26 DIAGNOSIS — M25562 Pain in left knee: Secondary | ICD-10-CM | POA: Diagnosis not present

## 2020-04-26 DIAGNOSIS — Z6841 Body Mass Index (BMI) 40.0 and over, adult: Secondary | ICD-10-CM | POA: Diagnosis not present

## 2020-05-02 ENCOUNTER — Ambulatory Visit (HOSPITAL_COMMUNITY): Payer: BC Managed Care – PPO

## 2020-12-18 DIAGNOSIS — Z0000 Encounter for general adult medical examination without abnormal findings: Secondary | ICD-10-CM | POA: Diagnosis not present

## 2021-02-20 DIAGNOSIS — J069 Acute upper respiratory infection, unspecified: Secondary | ICD-10-CM | POA: Diagnosis not present

## 2021-03-21 DIAGNOSIS — R2243 Localized swelling, mass and lump, lower limb, bilateral: Secondary | ICD-10-CM | POA: Diagnosis not present

## 2021-03-21 DIAGNOSIS — H9313 Tinnitus, bilateral: Secondary | ICD-10-CM | POA: Diagnosis not present

## 2021-03-21 DIAGNOSIS — J019 Acute sinusitis, unspecified: Secondary | ICD-10-CM | POA: Diagnosis not present

## 2021-03-21 DIAGNOSIS — R070 Pain in throat: Secondary | ICD-10-CM | POA: Diagnosis not present

## 2021-04-07 ENCOUNTER — Ambulatory Visit: Payer: Self-pay

## 2021-04-07 ENCOUNTER — Other Ambulatory Visit: Payer: Self-pay

## 2021-04-07 ENCOUNTER — Ambulatory Visit
Admission: EM | Admit: 2021-04-07 | Discharge: 2021-04-07 | Disposition: A | Discharge status: Home / Self Care | Payer: BC Managed Care – PPO

## 2021-04-07 ENCOUNTER — Ambulatory Visit (INDEPENDENT_AMBULATORY_CARE_PROVIDER_SITE_OTHER): Payer: BC Managed Care – PPO

## 2021-04-07 DIAGNOSIS — J4530 Mild persistent asthma, uncomplicated: Secondary | ICD-10-CM

## 2021-04-07 DIAGNOSIS — R059 Cough, unspecified: Secondary | ICD-10-CM

## 2021-04-07 DIAGNOSIS — R0602 Shortness of breath: Secondary | ICD-10-CM | POA: Diagnosis not present

## 2021-04-07 DIAGNOSIS — F17200 Nicotine dependence, unspecified, uncomplicated: Secondary | ICD-10-CM

## 2021-04-07 DIAGNOSIS — R0789 Other chest pain: Secondary | ICD-10-CM | POA: Diagnosis not present

## 2021-04-07 DIAGNOSIS — R053 Chronic cough: Secondary | ICD-10-CM

## 2021-04-07 MED ORDER — PREDNISONE 50 MG PO TABS: 50.0000 mg | ORAL_TABLET | Freq: Every day | ORAL | 0 refills | Status: DC

## 2021-04-07 NOTE — ED Triage Notes (Signed)
Pt reports chest congestion, cough, pain under ribs x 2 days. States he is having same symptoms since December 2022 on and off. Reports he felt better last week after he finished the antibiotics.

## 2021-04-07 NOTE — ED Provider Notes (Signed)
[facility]   MRN: [id number] DOB: 1976-01-16  Subjective:   Cory Page is a 46 y.o. male presenting for 2-day history of recurrent coughing, chest congestion, chest pain.  He came to our clinic today because he woke up with a bad headache from his coughing.  He also thinks that this is related to not smoking over the past 2 days.  He usually does 2 packs/day but stopped because of his sickness.  Reports that he has been sick off and on since December.  Initially he had azithromycin but was recently prescribed a different antibiotic 2 weeks ago.  He finished this 1 week ago.  He did have intermittent improvement while on the antibiotics.  He was also prescribed an oral steroid course but did not take this.  He has had an albuterol inhaler that he is used sparingly.  He does have a history of moderate persistent asthma.  No current facility-administered medications for this encounter.  Current Outpatient Medications:    dextromethorphan-guaiFENesin (MUCINEX DM) 30-600 MG 12hr tablet, Take 1 tablet by mouth 2 (two) times daily., Disp: , Rfl:    albuterol (VENTOLIN HFA) 108 (90 Base) MCG/ACT inhaler, Inhale 2 puffs into the lungs every 6 (six) hours as needed for wheezing or shortness of breath., Disp: 8 g, Rfl: 0   benzonatate (TESSALON) 100 MG capsule, Take 1 capsule (100 mg total) by mouth 3 (three) times daily as needed for cough. (Patient not taking: Reported on 03/27/2020), Disp: 30 capsule, Rfl: 0   cetirizine (ZYRTEC ALLERGY) 10 MG tablet, Take 1 tablet (10 mg total) by mouth daily. (Patient not taking: Reported on 03/27/2020), Disp: 30 tablet, Rfl: 0   dexamethasone (DECADRON) 6 MG tablet, Take 1 tablet (6 mg total) by mouth 2 (two) times daily with a meal. (Patient not taking: Reported on 03/27/2020), Disp: 10 tablet, Rfl: 0   diclofenac (VOLTAREN) 75 MG EC tablet, Take 1 tablet (75 mg total) by mouth 2 (two) times daily with a meal., Disp: 60 tablet, Rfl: 2   fluticasone  (FLONASE) 50 MCG/ACT nasal spray, Place 1 spray into both nostrils daily for 14 days., Disp: 16 g, Rfl: 0   loratadine (CLARITIN) 10 MG tablet, Take 10 mg by mouth daily. (Patient not taking: Reported on 03/27/2020), Disp: , Rfl:    meloxicam (MOBIC) 7.5 MG tablet, Take 1 tablet (7.5 mg total) by mouth 2 (two) times daily. (Patient not taking: Reported on 03/27/2020), Disp: 60 tablet, Rfl: 0   predniSONE (DELTASONE) 10 MG tablet, Take 2 tablets (20 mg total) by mouth daily. (Patient not taking: Reported on 03/27/2020), Disp: 15 tablet, Rfl: 0   No Known Allergies  Past Medical History:  Diagnosis Date   Asthma      History reviewed. No pertinent surgical history.  Family History  Problem Relation Age of Onset   Diabetes Father    Heart disease Maternal Grandmother    Heart disease Maternal Grandfather    Cancer Paternal Grandfather        bladder    Social History   Tobacco Use   Smoking status: Every Day    Types: Cigarettes   Smokeless tobacco: Former  Substance Use Topics   Alcohol use: Not Currently   Drug use: Never    ROS   Objective:   Vitals: BP 139/85 (BP Location: Right Arm)    Pulse (!) 103    Temp 98.6 F (37 C) (Oral)    Resp (!) 22  SpO2 95%   Physical Exam Constitutional:      General: He is not in acute distress.    Appearance: Normal appearance. He is well-developed. He is obese. He is not ill-appearing, toxic-appearing or diaphoretic.  HENT:     Head: Normocephalic and atraumatic.     Right Ear: Tympanic membrane, ear canal and external ear normal. There is no impacted cerumen.     Left Ear: Tympanic membrane, ear canal and external ear normal. There is no impacted cerumen.     Nose: Congestion present. No rhinorrhea.     Mouth/Throat:     Mouth: Mucous membranes are moist.     Pharynx: No oropharyngeal exudate or posterior oropharyngeal erythema.  Eyes:     General: No scleral icterus.       Right eye: No discharge.        Left eye: No  discharge.     Extraocular Movements: Extraocular movements intact.     Conjunctiva/sclera: Conjunctivae normal.     Pupils: Pupils are equal, round, and reactive to light.  Cardiovascular:     Rate and Rhythm: Normal rate and regular rhythm.     Heart sounds: Normal heart sounds. No murmur heard.   No friction rub. No gallop.  Pulmonary:     Effort: Pulmonary effort is normal. No respiratory distress.     Breath sounds: Normal breath sounds. No stridor. No wheezing, rhonchi or rales.     Comments: Decreased lung sounds bilaterally. Musculoskeletal:     Cervical back: Normal range of motion and neck supple. No rigidity. No muscular tenderness.  Neurological:     General: No focal deficit present.     Mental Status: He is alert and oriented to person, place, and time.     Cranial Nerves: No cranial nerve deficit, dysarthria or facial asymmetry.     Motor: No weakness.     Coordination: Romberg sign negative. Coordination normal.     Gait: Gait normal.     Deep Tendon Reflexes: Reflexes normal.  Psychiatric:        Mood and Affect: Mood normal.        Speech: Speech normal.        Behavior: Behavior normal.        Thought Content: Thought content normal.        Judgment: Judgment normal.   DG Chest 2 View  Result Date: 04/07/2021 CLINICAL DATA:  Cough EXAM: CHEST - 2 VIEW COMPARISON:  Chest x-ray 02/15/2020 FINDINGS: Heart size and mediastinal contours are within normal limits. No suspicious pulmonary opacities identified. No pleural effusion or pneumothorax visualized. No acute osseous abnormality appreciated. IMPRESSION: No acute intrathoracic process identified. Electronically Signed   By: Jannifer Hick M.D.   On: 04/07/2021 09:40    Assessment and Plan :   PDMP not reviewed this encounter.  1. Persistent cough   2. Heavy smoker   3. Atypical chest pain   4. Shortness of breath   5. Mild persistent asthma without complication    We will defer respiratory testing given  timeline of his illness.  In the context of his asthma, smoking and a negative chest x-ray I recommended using an oral prednisone course.  Use supportive care otherwise.  Does not need a refill on his albuterol inhaler.  Follow-up with PCP as soon as possible. Counseled patient on potential for adverse effects with medications prescribed/recommended today, ER and return-to-clinic precautions discussed, patient verbalized understanding.    Wallis Bamberg, PA-C 04/07/21 1006

## 2021-07-18 DIAGNOSIS — R7301 Impaired fasting glucose: Secondary | ICD-10-CM | POA: Diagnosis not present

## 2021-07-18 DIAGNOSIS — I10 Essential (primary) hypertension: Secondary | ICD-10-CM | POA: Diagnosis not present

## 2021-07-18 DIAGNOSIS — E782 Mixed hyperlipidemia: Secondary | ICD-10-CM | POA: Diagnosis not present

## 2021-07-21 DIAGNOSIS — F17200 Nicotine dependence, unspecified, uncomplicated: Secondary | ICD-10-CM | POA: Diagnosis not present

## 2021-07-21 DIAGNOSIS — E782 Mixed hyperlipidemia: Secondary | ICD-10-CM | POA: Diagnosis not present

## 2021-07-21 DIAGNOSIS — I10 Essential (primary) hypertension: Secondary | ICD-10-CM | POA: Diagnosis not present

## 2021-07-21 DIAGNOSIS — R2243 Localized swelling, mass and lump, lower limb, bilateral: Secondary | ICD-10-CM | POA: Diagnosis not present

## 2021-10-23 DIAGNOSIS — I10 Essential (primary) hypertension: Secondary | ICD-10-CM | POA: Diagnosis not present

## 2021-10-27 DIAGNOSIS — F17200 Nicotine dependence, unspecified, uncomplicated: Secondary | ICD-10-CM | POA: Diagnosis not present

## 2021-10-27 DIAGNOSIS — I10 Essential (primary) hypertension: Secondary | ICD-10-CM | POA: Diagnosis not present

## 2021-10-27 DIAGNOSIS — E782 Mixed hyperlipidemia: Secondary | ICD-10-CM | POA: Diagnosis not present

## 2021-11-26 DIAGNOSIS — B9689 Other specified bacterial agents as the cause of diseases classified elsewhere: Secondary | ICD-10-CM | POA: Diagnosis not present

## 2021-11-26 DIAGNOSIS — L02821 Furuncle of head [any part, except face]: Secondary | ICD-10-CM | POA: Diagnosis not present

## 2021-11-26 DIAGNOSIS — L578 Other skin changes due to chronic exposure to nonionizing radiation: Secondary | ICD-10-CM | POA: Diagnosis not present

## 2021-12-19 DIAGNOSIS — L02821 Furuncle of head [any part, except face]: Secondary | ICD-10-CM | POA: Diagnosis not present

## 2021-12-19 DIAGNOSIS — B9689 Other specified bacterial agents as the cause of diseases classified elsewhere: Secondary | ICD-10-CM | POA: Diagnosis not present

## 2022-01-08 DIAGNOSIS — U071 COVID-19: Secondary | ICD-10-CM | POA: Diagnosis not present

## 2022-04-18 ENCOUNTER — Encounter: Payer: Self-pay | Admitting: Radiology

## 2023-01-11 IMAGING — DX DG CHEST 2V
2 series · 2 of 2 positions shown · non-contrast
Comparison: Chest x-ray 02/15/2020

CLINICAL DATA: Cough

EXAM:
CHEST - 2 VIEW

[chest pa]
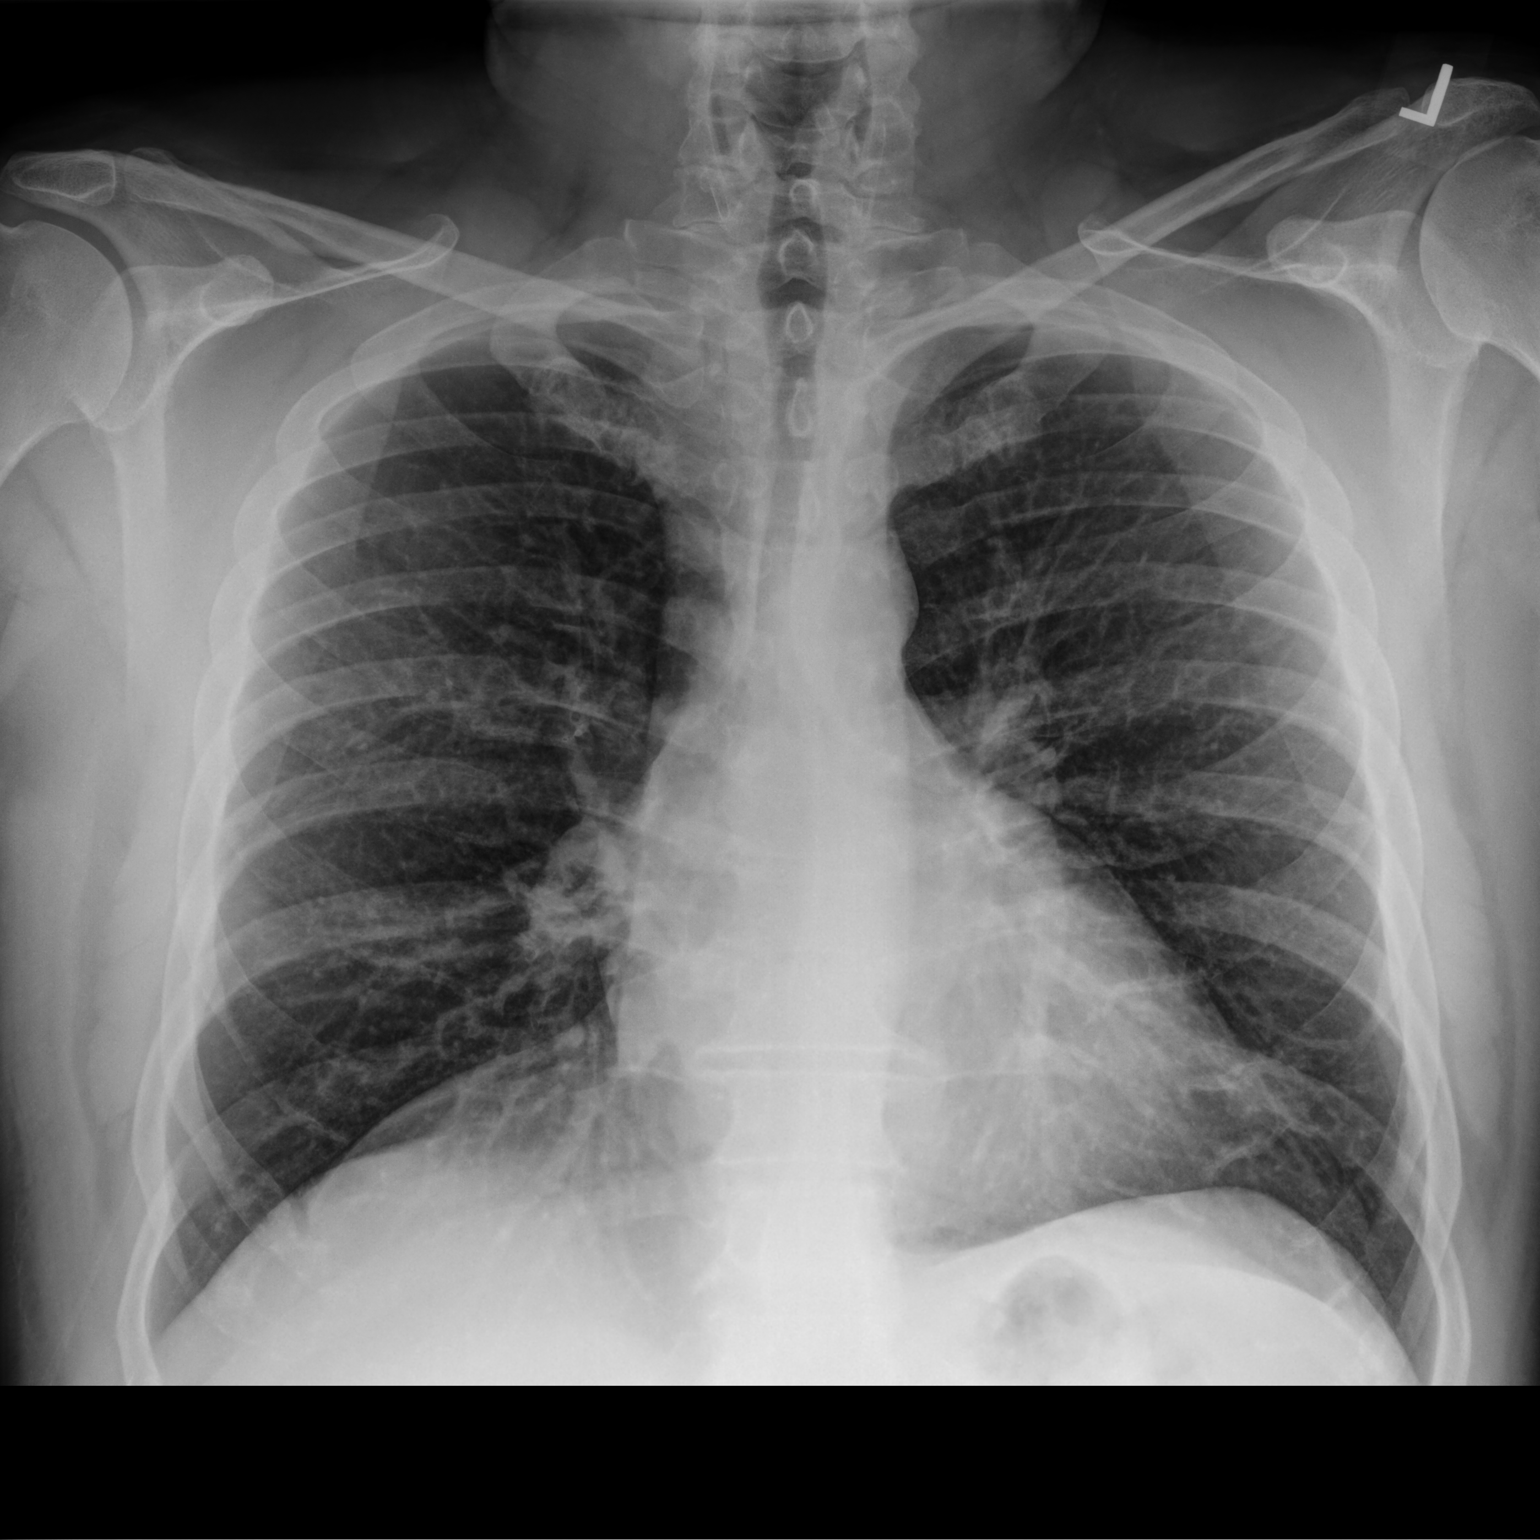

[chest lat]
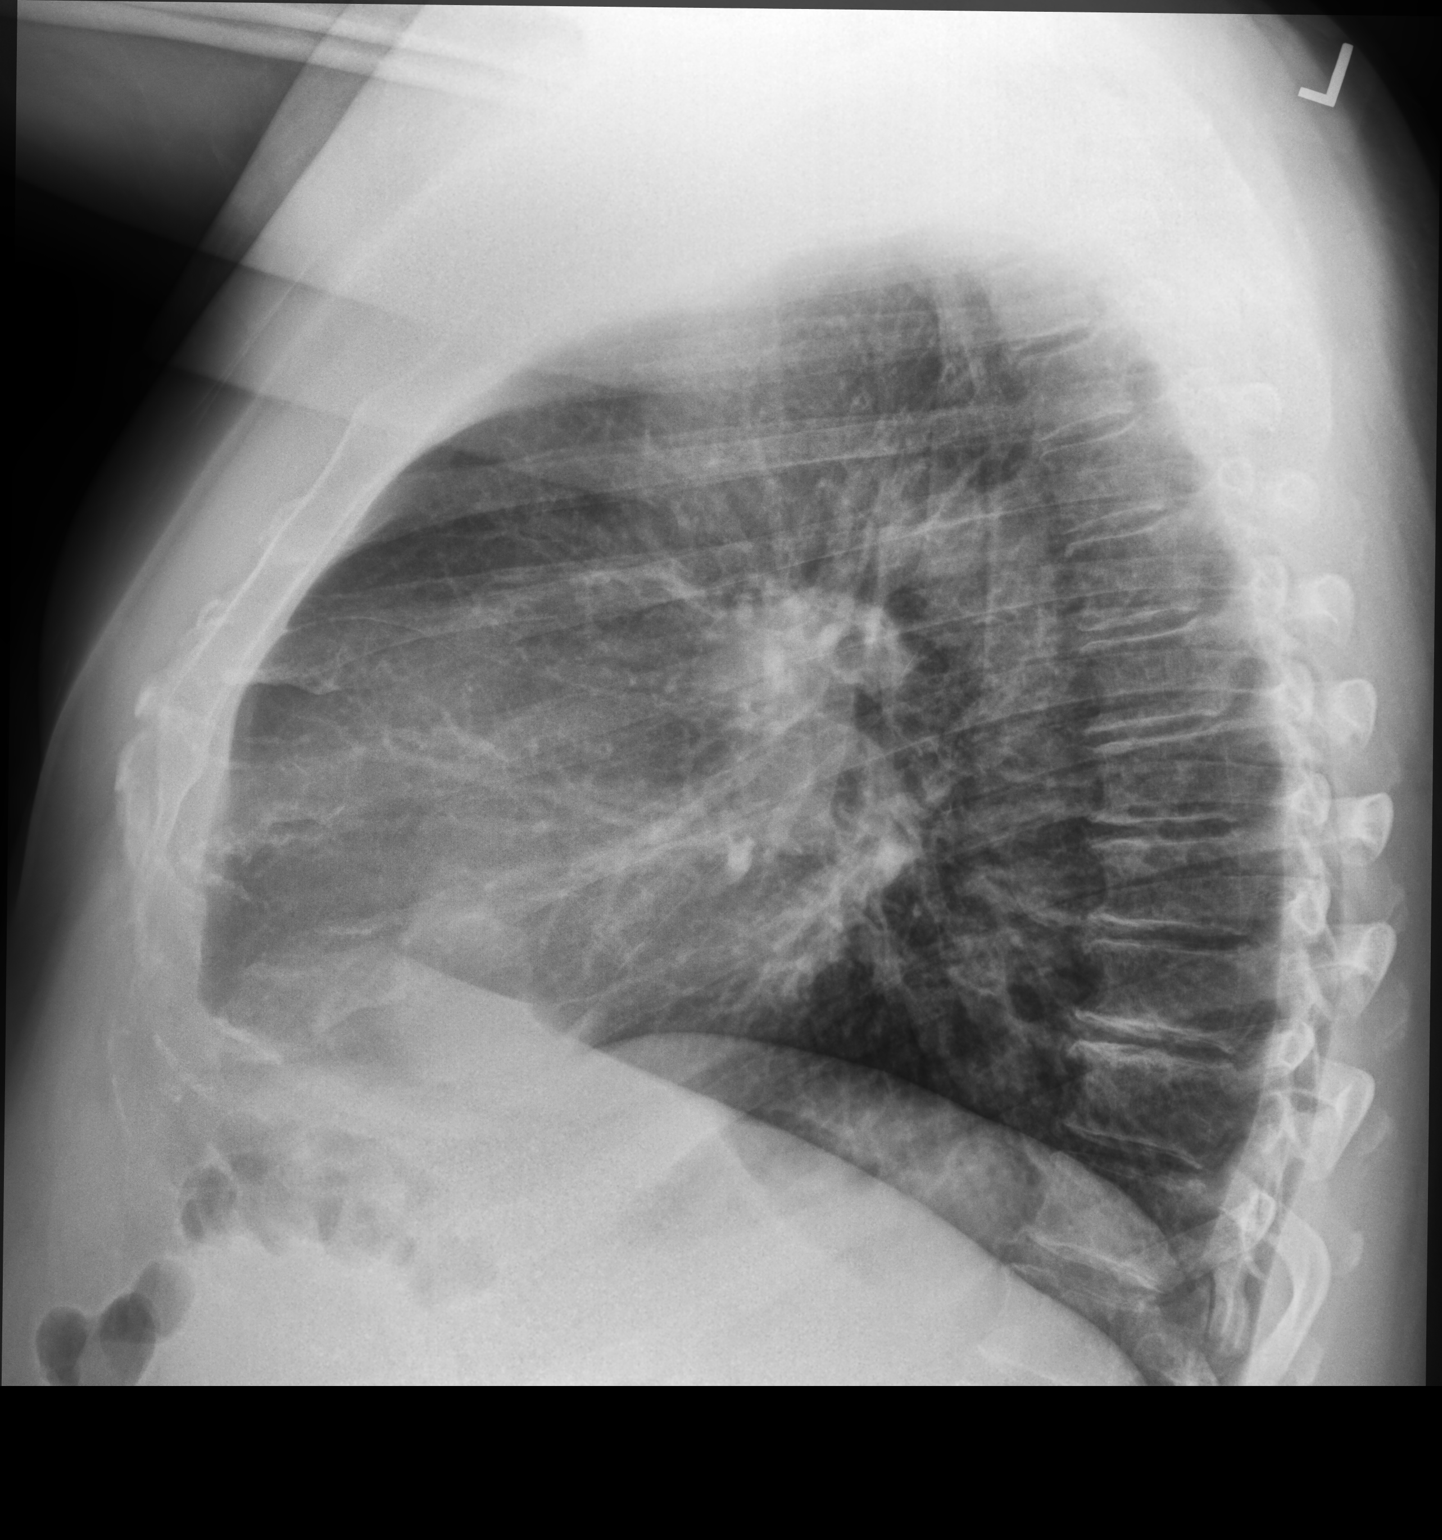

[2 of 2 positions shown; findings below may reference images not displayed]

FINDINGS: Heart size and mediastinal contours are within normal limits. No
suspicious pulmonary opacities identified.

No pleural effusion or pneumothorax visualized.

No acute osseous abnormality appreciated.
IMPRESSION: No acute intrathoracic process identified.

## 2023-05-23 ENCOUNTER — Ambulatory Visit: Admission: EM | Admit: 2023-05-23 | Discharge: 2023-05-23 | Disposition: A | Discharge status: Home / Self Care

## 2023-05-23 DIAGNOSIS — M79645 Pain in left finger(s): Secondary | ICD-10-CM | POA: Diagnosis not present

## 2023-05-23 DIAGNOSIS — Z23 Encounter for immunization: Secondary | ICD-10-CM

## 2023-05-23 DIAGNOSIS — Z203 Contact with and (suspected) exposure to rabies: Secondary | ICD-10-CM

## 2023-05-23 DIAGNOSIS — W5501XA Bitten by cat, initial encounter: Secondary | ICD-10-CM

## 2023-05-23 MED ORDER — TETANUS-DIPHTH-ACELL PERTUSSIS 5-2.5-18.5 LF-MCG/0.5 IM SUSY
0.5000 mL | PREFILLED_SYRINGE | Freq: Once | INTRAMUSCULAR | Status: AC
  Administered 2023-05-23: 0.5 mL via INTRAMUSCULAR

## 2023-05-23 MED ORDER — CHLORHEXIDINE GLUCONATE 4 % EX SOLN: Freq: Every day | CUTANEOUS | 0 refills | Status: AC | PRN

## 2023-05-23 MED ORDER — MUPIROCIN 2 % EX OINT: 1.0000 | TOPICAL_OINTMENT | Freq: Two times a day (BID) | CUTANEOUS | 0 refills | Status: DC

## 2023-05-23 MED ORDER — AMOXICILLIN-POT CLAVULANATE 875-125 MG PO TABS: 1.0000 | ORAL_TABLET | Freq: Two times a day (BID) | ORAL | 0 refills | Status: AC

## 2023-05-23 NOTE — Discharge Instructions (Signed)
 Clean the area twice a day with Hibiclens and apply the mupirocin ointment and a dressing.  Take the full course of antibiotics.  Follow-up for worsening or unresolving symptoms.  We have updated your tetanus shot today.

## 2023-05-23 NOTE — ED Triage Notes (Signed)
 Pt reports he his cat bit him on his left index finger and left thumb x 1 day  Reports cat is up to date on his shots Reports while squeezing his thumb yellow puss came out.   Last tetanus unknown

## 2023-05-23 NOTE — ED Provider Notes (Signed)
 [facility]    CSN: [id number] Arrival date & time: 05/23/23  1638      History   Chief Complaint No chief complaint on file.   HPI Cory Page is a 48 y.o. male.   Patient presenting today with a puncture wound to the left distal thumb from his cat biting him yesterday.  Denies redness, fevers, numbness, tingling, loss of range of motion.  So far not trying anything for symptoms.  States his cat is up-to-date on all shots.  He is unsure when his last tetanus shot was, thinks it was more than 5 years ago.    Past Medical History:  Diagnosis Date   Asthma     There are no active problems to display for this patient.   History reviewed. No pertinent surgical history.     Home Medications    Prior to Admission medications   Medication Sig Start Date End Date Taking? Authorizing Provider  amoxicillin-clavulanate (AUGMENTIN) 875-125 MG tablet Take 1 tablet by mouth every 12 (twelve) hours. 05/23/23  Yes Particia Nearing, PA-C  atorvastatin (LIPITOR) 20 MG tablet Take 1 tablet by mouth at bedtime. 08/07/22  Yes [provider]  chlorhexidine (HIBICLENS) 4 % external liquid Apply topically daily as needed. 05/23/23  Yes Particia Nearing, PA-C  mupirocin ointment (BACTROBAN) 2 % Apply 1 Application topically 2 (two) times daily. 05/23/23  Yes Particia Nearing, PA-C  albuterol (VENTOLIN HFA) 108 (90 Base) MCG/ACT inhaler Inhale 2 puffs into the lungs every 6 (six) hours as needed for wheezing or shortness of breath. 02/15/20   Elson Areas, PA-C  benzonatate (TESSALON) 100 MG capsule Take 1 capsule (100 mg total) by mouth 3 (three) times daily as needed for cough. Patient not taking: Reported on 03/27/2020 02/06/20   Durward Parcel, FNP  cetirizine (ZYRTEC ALLERGY) 10 MG tablet Take 1 tablet (10 mg total) by mouth daily. Patient not taking: Reported on 03/27/2020 02/06/20   Durward Parcel, FNP  dexamethasone (DECADRON) 6 MG tablet Take 1  tablet (6 mg total) by mouth 2 (two) times daily with a meal. Patient not taking: Reported on 03/27/2020 02/15/20   Elson Areas, PA-C  dextromethorphan-guaiFENesin [facility] DM) 30-600 MG 12hr tablet Take 1 tablet by mouth 2 (two) times daily.    [provider]  diclofenac (VOLTAREN) 75 MG EC tablet Take 1 tablet (75 mg total) by mouth 2 (two) times daily with a meal. 03/27/20   Vickki Hearing, MD  fluticasone [facility]) 50 MCG/ACT nasal spray Place 1 spray into both nostrils daily for 14 days. 02/06/20 02/20/20  Avegno, Zachery Dakins, FNP  loratadine (CLARITIN) 10 MG tablet Take 10 mg by mouth daily. Patient not taking: Reported on 03/27/2020    [provider]  meloxicam (MOBIC) 7.5 MG tablet Take 1 tablet (7.5 mg total) by mouth 2 (two) times daily. Patient not taking: Reported on 03/27/2020 01/04/19   Vickki Hearing, MD  predniSONE (DELTASONE) 50 MG tablet Take 1 tablet (50 mg total) by mouth daily with breakfast. 04/07/21   Wallis Bamberg, PA-C    Family History Family History  Problem Relation Age of Onset   Diabetes Father    Heart disease Maternal Grandmother    Heart disease Maternal Grandfather    Cancer Paternal Grandfather        bladder    Social History Social History   Tobacco Use   Smoking status: Every Day    Types: Cigarettes  Smokeless tobacco: Former  Substance Use Topics   Alcohol use: Not Currently   Drug use: Never     Allergies   Patient has no known allergies.   Review of Systems Review of Systems PER HPI  Physical Exam Triage Vital Signs ED Triage Vitals  Encounter Vitals Group     BP 05/23/23 1647 (!) 150/101     Systolic BP Percentile --      Diastolic BP Percentile --      Pulse Rate 05/23/23 1647 (!) 105     Resp 05/23/23 1647 20     Temp 05/23/23 1647 98 F (36.7 C)     Temp Source 05/23/23 1647 Oral     SpO2 05/23/23 1647 94 %     Weight --      Height --      Head Circumference --      Peak Flow --      Pain  Score 05/23/23 1648 2     Pain Loc --      Pain Education --      Exclude from Growth Chart --    No data found.  Updated Vital Signs BP (!) 150/101 (BP Location: Right Arm)   Pulse (!) 105   Temp 98 F (36.7 C) (Oral)   Resp 20   SpO2 94%   Visual Acuity Right Eye Distance:   Left Eye Distance:   Bilateral Distance:    Right Eye Near:   Left Eye Near:    Bilateral Near:     Physical Exam Vitals and nursing note reviewed.  Constitutional:      Appearance: Normal appearance.  HENT:     Head: Atraumatic.  Eyes:     Extraocular Movements: Extraocular movements intact.     Conjunctiva/sclera: Conjunctivae normal.  Cardiovascular:     Rate and Rhythm: Normal rate and regular rhythm.  Pulmonary:     Effort: Pulmonary effort is normal.     Breath sounds: Normal breath sounds.  Musculoskeletal:        General: Normal range of motion.     Cervical back: Normal range of motion and neck supple.  Skin:    General: Skin is warm and dry.     Findings: Erythema present.  Neurological:     General: No focal deficit present.     Mental Status: He is oriented to person, place, and time.     Comments: Left UE neurovascularly intact  Psychiatric:        Mood and Affect: Mood normal.        Thought Content: Thought content normal.        Judgment: Judgment normal.     UC Treatments / Results  Labs (all labs ordered are listed, but only abnormal results are displayed) Labs Reviewed - No data to display  EKG   Radiology No results found.  Procedures Procedures (including critical care time)  Medications Ordered in UC Medications  Tdap (BOOSTRIX) injection 0.5 mL (0.5 mLs Intramuscular Given 05/23/23 1721)    Initial Impression / Assessment and Plan / UC Course  I have reviewed the triage vital signs and the nursing notes.  Pertinent labs & imaging results that were available during my care of the patient were reviewed by me and considered in my medical decision  making (see chart for details).     Given puncture injury from cat, will treat with Augmentin, mupirocin, Hibiclens, good home wound care.  Tdap updated as he did not  know when his last one was but did feel that it was more than 5 years ago.  Wound cleaned and dressed prior to discharge.  Return for worsening symptoms.  Final Clinical Impressions(s) / UC Diagnoses   Final diagnoses:  Thumb pain, left  Cat bite, initial encounter  Need for Tdap vaccination     Discharge Instructions      Clean the area twice a day with Hibiclens and apply the mupirocin ointment and a dressing.  Take the full course of antibiotics.  Follow-up for worsening or unresolving symptoms.  We have updated your tetanus shot today.    ED Prescriptions     Medication Sig Dispense Auth. Provider   amoxicillin-clavulanate (AUGMENTIN) 875-125 MG tablet Take 1 tablet by mouth every 12 (twelve) hours. 14 tablet Particia Nearing, [place]   mupirocin ointment (BACTROBAN) 2 % Apply 1 Application topically 2 (two) times daily. 22 g Particia Nearing, [place]   chlorhexidine (HIBICLENS) 4 % external liquid Apply topically daily as needed. 236 mL Particia Nearing, [place]      PDMP not reviewed this encounter.   Particia Nearing, [place] 05/24/23 1109

## 2023-06-27 ENCOUNTER — Other Ambulatory Visit (HOSPITAL_COMMUNITY): Payer: Self-pay | Admitting: Nurse Practitioner

## 2023-06-27 ENCOUNTER — Encounter: Payer: Self-pay | Admitting: Neurology

## 2023-06-27 DIAGNOSIS — H539 Unspecified visual disturbance: Secondary | ICD-10-CM

## 2023-07-09 ENCOUNTER — Ambulatory Visit (HOSPITAL_COMMUNITY)
Admission: RE | Admit: 2023-07-09 | Discharge: 2023-07-09 | Disposition: A | Discharge status: Home / Self Care | Source: Ambulatory Visit | Attending: Nurse Practitioner | Admitting: Nurse Practitioner

## 2023-07-09 DIAGNOSIS — H539 Unspecified visual disturbance: Secondary | ICD-10-CM | POA: Insufficient documentation

## 2023-08-20 NOTE — Progress Notes (Signed)
 Initial neurology clinic note  Cory Page MRN: [id number] DOB: Sep 20, 1975  Referring provider: Shona Norleen PEDLAR, MD  Primary care provider: Shona Norleen PEDLAR, MD  Reason for consult:  vision changes  Subjective:  This is Mr. Cory Page, a 48 y.o. male with a medical history of HTN, HLD, current smoker who presents to neurology clinic with blurry vision. The patient is accompanied by wife.  Patient started having blurry vision for about 1 year. It would happen more at the end of the day at work or getting home. He felt there were areas of vision that were not present. He would see prisms or rainbows in his peripheral vision. He was not seeing double. He denies any headaches. He mentions he would eat a candy bar and it would help symptoms. He also mentions that he started consistently wearing his glasses and symptoms improved soon after.  He denies ptosis, chewing difficulty, swallowing difficulties. He denies new breathing problems. He denies any current arm or leg weakness.  Patient saw an eye doctor and his eyes checked out okay. Patient's vision improved around 04/2023. He then started losartan for BP in 06/2023.  He never had similar symptoms in the past.  EtOH use: once a year Restrictive diet: No Family history of neurologic disease: no    MEDICATIONS:  Outpatient Encounter Medications as of 09/03/2023  Medication Sig   albuterol  (VENTOLIN  HFA) 108 (90 Base) MCG/ACT inhaler Inhale 2 puffs into the lungs every 6 (six) hours as needed for wheezing or shortness of breath.   atorvastatin (LIPITOR) 20 MG tablet Take 1 tablet by mouth at bedtime.   cetirizine  (ZYRTEC  ALLERGY) 10 MG tablet Take 1 tablet (10 mg total) by mouth daily.   losartan (COZAAR) 25 MG tablet Take 25 mg by mouth daily.   pravastatin (PRAVACHOL) 40 MG tablet Take 1 tablet by mouth daily.   amoxicillin -clavulanate (AUGMENTIN ) 875-125 MG tablet Take 1 tablet by mouth every 12 (twelve) hours. (Patient not taking:  Reported on 09/03/2023)   benzonatate  (TESSALON ) 100 MG capsule Take 1 capsule (100 mg total) by mouth 3 (three) times daily as needed for cough. (Patient not taking: Reported on 03/27/2020)   chlorhexidine  (HIBICLENS ) 4 % external liquid Apply topically daily as needed.   dexamethasone  (DECADRON ) 6 MG tablet Take 1 tablet (6 mg total) by mouth 2 (two) times daily with a meal. (Patient not taking: Reported on 03/27/2020)   dextromethorphan-guaiFENesin (MUCINEX DM) 30-600 MG 12hr tablet Take 1 tablet by mouth 2 (two) times daily.   diclofenac  (VOLTAREN ) 75 MG EC tablet Take 1 tablet (75 mg total) by mouth 2 (two) times daily with a meal. (Patient not taking: Reported on 09/03/2023)   fluticasone  (FLONASE ) 50 MCG/ACT nasal spray Place 1 spray into both nostrils daily for 14 days.   loratadine (CLARITIN) 10 MG tablet Take 10 mg by mouth daily. (Patient not taking: Reported on 03/27/2020)   meloxicam  (MOBIC ) 7.5 MG tablet Take 1 tablet (7.5 mg total) by mouth 2 (two) times daily. (Patient not taking: Reported on 03/27/2020)   mupirocin  ointment (BACTROBAN ) 2 % Apply 1 Application topically 2 (two) times daily.   predniSONE  (DELTASONE ) 50 MG tablet Take 1 tablet (50 mg total) by mouth daily with breakfast.   No facility-administered encounter medications on file as of 09/03/2023.    PAST MEDICAL HISTORY: Past Medical History:  Diagnosis Date   Asthma    Hyperlipidemia    Hypertension     PAST SURGICAL HISTORY: History  reviewed. No pertinent surgical history.  ALLERGIES: No Known Allergies  FAMILY HISTORY: Family History  Problem Relation Age of Onset   Diabetes Father    Congestive Heart Failure Father    Heart disease Maternal Grandmother    Heart disease Maternal Grandfather    Cancer Paternal Grandfather        bladder    SOCIAL HISTORY: Social History   Tobacco Use   Smoking status: Every Day    Types: Cigarettes   Smokeless tobacco: Former  Substance Use Topics   Alcohol use: Not  Currently   Drug use: Never   Social History   Social History Narrative   Right handed   Lives with wife   One floor home    Currently employed   Drinks sodas    Objective:  Vital Signs:  BP (!) 148/93   Pulse 97   Resp 20   Ht 6' (1.829 m)   Wt (!) 301 lb (136.5 kg)   SpO2 96%   BMI 40.82 kg/m   General: No acute distress.  Patient appears well-groomed.   Head:  Normocephalic/atraumatic Neck: supple Back: No paraspinal tenderness Heart: regular rate and rhythm Lungs: Clear to auscultation bilaterally. Vascular: No carotid bruits.  Neurological Exam: Mental status: alert and oriented, speech fluent and not dysarthric, language intact.  Cranial nerves: CN I: not tested CN II: pupils equal, round and reactive to light, visual fields intact CN III, IV, VI:  full range of motion, no nystagmus, no ptosis. Intermittent diplopia with sustained up gaze. [place]ogan lid twitch negative. CN V: facial sensation intact. CN VII: upper and lower face symmetric and strong (orbicularis oris and orbicularis oculi) CN VIII: hearing intact CN IX, X: uvula midline CN XI: sternocleidomastoid and trapezius muscles intact CN XII: tongue midline  Bulk & Tone: normal Motor:  muscle strength 5/5 throughout Deep Tendon Reflexes:  2+ throughout.   Sensation:  Pinprick, vibratory, and proprioceptive sensation intact. Finger to nose testing:  Without dysmetria.   Heel to shin:  Without dysmetria.   Gait:  Normal station and stride.  Romberg negative.   Labs and Imaging review: External labs: 06/20/23: HbA1c: 5.6 Lipid panel: tChol 102, LDL 43, TG 126 CMP unremarkable CBC unremarkable  Imaging/Procedures: Carotid ultrasound (07/09/23): IMPRESSION: Color duplex indicates no significant plaque, with no hemodynamically significant stenosis by duplex criteria in the extracranial cerebrovascular circulation.  Assessment/Plan:  Cory Page is a 48 y.o. male who presents for evaluation of  blurry vision. He has a relevant medical history of HTN, HLD, current smoker. His neurological examination is essentially normal. He does have some intermittent (coming and going) of diplopia with sustained up gaze. The etiology of patient's symptoms is currently unclear. Possible etiologies include dry eyes, HTN, poor use of glasses, ocular migraines, thyroid eye disease, or even myasthenia gravis (MG). While MG is less likely, I will test for antibodies today. Fortunately, his symptoms resolved months ago. If they return, further work up or treatment could be considered.  PLAN: -Blood work: AChR abs, TSH -Smoking cessation discussed -Patient to monitor symptoms and call if vision changes recur  -Return to clinic as needed  The impression above as well as the plan as outlined below were extensively discussed with the patient (in the company of wife) who voiced understanding. All questions were answered to their satisfaction.  When available, results of the above investigations and possible further recommendations will be communicated to the patient via telephone/MyChart. Patient to call office if not  contacted after expected testing turnaround time.   Total time spent reviewing records, interview, history/exam, documentation, and coordination of care on day of encounter:  45 min   Thank you for allowing me to participate in patient's care.  If I can answer any additional questions, I would be pleased to do so.  Venetia Potters, MD   CC: Shona Norleen PEDLAR, MD [address] [place] [address]  CC: Referring provider: Shona Norleen PEDLAR, MD [address] [place],  [place] [address]

## 2023-09-03 ENCOUNTER — Encounter: Payer: Self-pay | Admitting: Neurology

## 2023-09-03 ENCOUNTER — Ambulatory Visit: Admitting: Neurology

## 2023-09-03 ENCOUNTER — Other Ambulatory Visit

## 2023-09-03 VITALS — BP 148/93 | HR 97 | Resp 20 | Ht 72.0 in | Wt 301.0 lb

## 2023-09-03 DIAGNOSIS — I10 Essential (primary) hypertension: Secondary | ICD-10-CM | POA: Diagnosis not present

## 2023-09-03 DIAGNOSIS — H538 Other visual disturbances: Secondary | ICD-10-CM | POA: Diagnosis not present

## 2023-09-03 DIAGNOSIS — E785 Hyperlipidemia, unspecified: Secondary | ICD-10-CM | POA: Insufficient documentation

## 2023-09-03 DIAGNOSIS — F17200 Nicotine dependence, unspecified, uncomplicated: Secondary | ICD-10-CM | POA: Diagnosis not present

## 2023-09-03 NOTE — Patient Instructions (Signed)
 I saw you today for blurry vision. There are many causes of blurry vision, so it is difficult to always know why. Luckily this has improved since March of this year.   I would like to get a couple of labs to look for other causes. I will be in touch when I have your results.  If your symptoms return, please let me know. Otherwise, the plan will be to monitor.  Please let me know if you have any questions or concerns in the meantime.   The physicians and staff at [facility] are committed to providing excellent care. You may receive a survey requesting feedback about your experience at our office. We strive to receive very good responses to the survey questions. If you feel that your experience would prevent you from giving the office a very good  response, please contact our office to try to remedy the situation. We may be reached at [phone]. Thank you for taking the time out of your busy day to complete the survey.  Venetia Potters, MD [facility]

## 2023-09-09 LAB — MYASTHENIA GRAVIS PANEL 2
A CHR BINDING ABS: <0.3 nmol/L
ACHR Blocking Abs: <15 %{inhibition} (ref <15)
Acetylchol Modul Ab: 3 %{inhibition}

## 2023-09-09 LAB — TSH: TSH: 1.29 m[IU]/L (ref 0.40–4.50)

## 2023-09-10 ENCOUNTER — Ambulatory Visit: Payer: Self-pay | Admitting: Neurology
